# Patient Record
Sex: Male | Born: 1970 | Race: Black or African American | Hispanic: No | Marital: Married | Smoking: Current every day smoker
Health system: Southern US, Community
[De-identification: ages and names within clinical notes are randomized; demographics above are authoritative.]

## PROBLEM LIST (undated history)

## (undated) DIAGNOSIS — T7840XA Allergy, unspecified, initial encounter: Secondary | ICD-10-CM

## (undated) HISTORY — PX: HERNIA REPAIR: SHX51

## (undated) HISTORY — PX: APPENDECTOMY: SHX54

---

## 2018-03-08 ENCOUNTER — Other Ambulatory Visit: Payer: Self-pay

## 2018-03-08 ENCOUNTER — Encounter (HOSPITAL_BASED_OUTPATIENT_CLINIC_OR_DEPARTMENT_OTHER): Payer: Self-pay | Admitting: Emergency Medicine

## 2018-03-08 ENCOUNTER — Emergency Department (HOSPITAL_BASED_OUTPATIENT_CLINIC_OR_DEPARTMENT_OTHER)
Admission: EM | Admit: 2018-03-08 | Discharge: 2018-03-08 | Disposition: A | Discharge status: Home / Self Care | Payer: Medicaid Other | Attending: Emergency Medicine | Admitting: Emergency Medicine

## 2018-03-08 ENCOUNTER — Emergency Department (HOSPITAL_BASED_OUTPATIENT_CLINIC_OR_DEPARTMENT_OTHER): Payer: Medicaid Other

## 2018-03-08 DIAGNOSIS — R079 Chest pain, unspecified: Secondary | ICD-10-CM | POA: Diagnosis present

## 2018-03-08 DIAGNOSIS — R0789 Other chest pain: Secondary | ICD-10-CM | POA: Insufficient documentation

## 2018-03-08 DIAGNOSIS — F17210 Nicotine dependence, cigarettes, uncomplicated: Secondary | ICD-10-CM | POA: Insufficient documentation

## 2018-03-08 LAB — CBC
HCT: 44.2 % (ref 39.0–52.0)
Hemoglobin: 13.8 g/dL (ref 13.0–17.0)
MCH: 28.8 pg (ref 26.0–34.0)
MCHC: 31.2 g/dL (ref 30.0–36.0)
MCV: 92.3 fL (ref 80.0–100.0)
Platelets: 237 10*3/uL (ref 150–400)
RBC: 4.79 MIL/uL (ref 4.22–5.81)
RDW: 14.1 % (ref 11.5–15.5)
WBC: 7.8 10*3/uL (ref 4.0–10.5)
nRBC: 0 % (ref 0.0–0.2)

## 2018-03-08 LAB — COMPREHENSIVE METABOLIC PANEL
ALT: 19 U/L (ref 0–44)
ANION GAP: 7 (ref 5–15)
AST: 21 U/L (ref 15–41)
Albumin: 4.1 g/dL (ref 3.5–5.0)
Alkaline Phosphatase: 55 U/L (ref 38–126)
BUN: 10 mg/dL (ref 6–20)
CO2: 24 mmol/L (ref 22–32)
Calcium: 8.8 mg/dL — ABNORMAL LOW (ref 8.9–10.3)
Chloride: 104 mmol/L (ref 98–111)
Creatinine, Ser: 0.75 mg/dL (ref 0.61–1.24)
GFR calc Af Amer: >60 mL/min (ref >60)
GFR calc non Af Amer: >60 mL/min (ref >60)
Glucose, Bld: 105 mg/dL — ABNORMAL HIGH (ref 70–99)
Potassium: 3.2 mmol/L — ABNORMAL LOW (ref 3.5–5.1)
Sodium: 135 mmol/L (ref 135–145)
Total Bilirubin: 0.2 mg/dL — ABNORMAL LOW (ref 0.3–1.2)
Total Protein: 7.1 g/dL (ref 6.5–8.1)

## 2018-03-08 LAB — TROPONIN I: Troponin I: <0.03 ng/mL (ref <0.03)

## 2018-03-08 LAB — LIPASE, BLOOD: Lipase: 19 U/L (ref 11–51)

## 2018-03-08 MED ORDER — KETOROLAC TROMETHAMINE 30 MG/ML IJ SOLN
30.0000 mg | Freq: Once | INTRAMUSCULAR | Status: AC
  Administered 2018-03-08: 30 mg via INTRAVENOUS
  Filled 2018-03-08: qty 1

## 2018-03-08 MED ORDER — IBUPROFEN 600 MG PO TABS: 600.0000 mg | ORAL_TABLET | Freq: Four times a day (QID) | ORAL | 0 refills | Status: AC | PRN

## 2018-03-08 MED ORDER — CYCLOBENZAPRINE HCL 10 MG PO TABS: 10.0000 mg | ORAL_TABLET | Freq: Two times a day (BID) | ORAL | 0 refills | Status: AC | PRN

## 2018-03-08 NOTE — ED Notes (Signed)
ED Provider at bedside. 

## 2018-03-08 NOTE — ED Triage Notes (Addendum)
L side chest pain for a few days. Pain increases with movement and touch.

## 2018-03-08 NOTE — ED Provider Notes (Signed)
[facility] EMERGENCY DEPARTMENT Provider Note   CSN: [id number] Arrival date & time: 03/08/18  1747     History   Chief Complaint Chief Complaint  Patient presents with  . Chest Pain    HPI Patrick Decker is a 48 y.o. male.  The history is provided by the patient. No language interpreter was used.  Chest Pain       48 year old male with history of tobacco abuse who presenting for evaluation of chest pain.  Patient report for the past 3 to 4 days he has been experiencing recurrent pain to his left side of chest.  Pain is a sharp pain sensation, worsening with movement.  Pain is minimal at this time but was more intense earlier part of the day.  He denies any associated fever, chills, lightheadedness, dizziness, shortness of breath, nausea, vomiting, abdominal pain, diaphoresis.  Pain is nonradiating.  He did recall roughhousing with his young children recently and was unsure if that may have caused this pain.  Aside from rest, no other specific treatment tried.  Patient does smoke a pack of cigarettes a day.  He denies any significant family history of cardiac disease.  He does admits to using alcohol on a regular basis but denies any recreational drug use.    History reviewed. No pertinent past medical history.  There are no active problems to display for this patient.   Past Surgical History:  Procedure Laterality Date  . HERNIA REPAIR          Home Medications    Prior to Admission medications   Not on File    Family History No family history on file.  Social History Social History   Tobacco Use  . Smoking status: Current Every Day Smoker  . Smokeless tobacco: Never Used  Substance Use Topics  . Alcohol use: Yes  . Drug use: Never     Allergies   Patient has no known allergies.   Review of Systems Review of Systems  Cardiovascular: Positive for chest pain.  All other systems reviewed and are negative.    Physical  Exam Updated Vital Signs BP 128/81 (BP Location: Left Arm)   Pulse 94   Temp 98.8 F (37.1 C) (Oral)   Resp 16   Ht 5\' 3"  (1.6 m)   Wt 70.3 kg   SpO2 99%   BMI 27.46 kg/m   Physical Exam Vitals signs and nursing note reviewed.  Constitutional:      General: He is not in acute distress.    Appearance: He is well-developed.  HENT:     Head: Atraumatic.  Eyes:     Conjunctiva/sclera: Conjunctivae normal.  Neck:     Musculoskeletal: Neck supple.  Cardiovascular:     Rate and Rhythm: Normal rate and regular rhythm.     Heart sounds: Heart sounds are distant.  Pulmonary:     Effort: Pulmonary effort is normal. Tachypnea present. No accessory muscle usage or respiratory distress.     Breath sounds: No stridor.  Chest:     Chest wall: Tenderness (Tenderness to left anterior inferior chest wall on palpation without any overlying skin changes, crepitus, or emphysema.) present.  Abdominal:     Palpations: Abdomen is soft.     Tenderness: There is no abdominal tenderness.  Musculoskeletal:        General: No swelling.  Skin:    Findings: No rash.  Neurological:     Mental Status: He is alert and oriented to  person, place, and time.  Psychiatric:        Mood and Affect: Mood normal.      ED Treatments / Results  Labs (all labs ordered are listed, but only abnormal results are displayed) Labs Reviewed  COMPREHENSIVE METABOLIC PANEL - Abnormal; Notable for the following components:      Result Value   Potassium 3.2 (*)    Glucose, Bld 105 (*)    Calcium 8.8 (*)    Total Bilirubin 0.2 (*)    All other components within normal limits  CBC  TROPONIN I  LIPASE, BLOOD    EKG EKG Interpretation  Date/Time:  Wednesday March 08 2018 17:55:15 EST Ventricular Rate:  94 PR Interval:  144 QRS Duration: 88 QT Interval:  346 QTC Calculation: 432 R Axis:   86 Text Interpretation:  Normal sinus rhythm Normal ECG No previous tracing Confirmed by Gwyneth Sprout ([id number]) on  03/08/2018 7:06:14 PM    Date: 03/08/2018  Rate: 94  Rhythm: normal sinus rhythm  QRS Axis: normal  Intervals: normal  ST/T Wave abnormalities: normal  Conduction Disutrbances: none  Narrative Interpretation:   Old EKG Reviewed: No significant changes noted     Radiology Dg Chest 2 View  Result Date: 03/08/2018 CLINICAL DATA:  Chest pain for 5 days. EXAM: CHEST - 2 VIEW COMPARISON:  None. FINDINGS: The heart size and mediastinal contours are within normal limits. There is no focal infiltrate, pulmonary edema, or pleural effusion. There is scoliosis of spine. IMPRESSION: No active cardiopulmonary disease. Electronically Signed   By: Sherian Rein M.D.   On: 03/08/2018 18:25    Procedures Procedures (including critical care time)  Medications Ordered in ED Medications  ketorolac (TORADOL) 30 MG/ML injection 30 mg (30 mg Intravenous Given 03/08/18 1854)     Initial Impression / Assessment and Plan / ED Course  I have reviewed the triage vital signs and the nursing notes.  Pertinent labs & imaging results that were available during my care of the patient were reviewed by me and considered in my medical decision making (see chart for details).     BP (!) 139/94   Pulse 95   Temp 98.8 F (37.1 C) (Oral)   Resp 12   Ht 5\' 3"  (1.6 m)   Wt 70.3 kg   SpO2 96%   BMI 27.46 kg/m    Final Clinical Impressions(s) / ED Diagnoses   Final diagnoses:  Left-sided chest wall pain    ED Discharge Orders         Ordered    ibuprofen (ADVIL,MOTRIN) 600 MG tablet  Every 6 hours PRN     03/08/18 1958    cyclobenzaprine (FLEXERIL) 10 MG tablet  2 times daily PRN     03/08/18 1958         6:27 PM Patient here with reproducible left-sided chest wall pain.  Pain is atypical for ACS.  Does not have any significant abdominal pain on exam however due to location and history of alcohol use, will check lipase and belly labs along with chest x-ray and troponin.  Toradol given.  Suspect  musculoskeletal pain at this time. HEART score of 2, low risk of MACE.   7:57 PM Work up unremarkable.  Will provide sxs treatment.  Pt agrees.  Smoking cessation discussed.  Return precaution given.     Fayrene Helper, PA-C 03/08/18 Babette Relic    Gwyneth Sprout, MD 03/09/18 2152

## 2019-01-03 ENCOUNTER — Encounter (HOSPITAL_BASED_OUTPATIENT_CLINIC_OR_DEPARTMENT_OTHER): Payer: Self-pay | Admitting: Emergency Medicine

## 2019-01-03 ENCOUNTER — Emergency Department (HOSPITAL_BASED_OUTPATIENT_CLINIC_OR_DEPARTMENT_OTHER): Payer: Medicaid Other

## 2019-01-03 ENCOUNTER — Emergency Department (HOSPITAL_BASED_OUTPATIENT_CLINIC_OR_DEPARTMENT_OTHER)
Admission: EM | Admit: 2019-01-03 | Discharge: 2019-01-03 | Disposition: A | Discharge status: Home / Self Care | Payer: Medicaid Other | Attending: Emergency Medicine | Admitting: Emergency Medicine

## 2019-01-03 DIAGNOSIS — Y9389 Activity, other specified: Secondary | ICD-10-CM | POA: Insufficient documentation

## 2019-01-03 DIAGNOSIS — S99922A Unspecified injury of left foot, initial encounter: Secondary | ICD-10-CM | POA: Diagnosis present

## 2019-01-03 DIAGNOSIS — W2203XA Walked into furniture, initial encounter: Secondary | ICD-10-CM | POA: Diagnosis not present

## 2019-01-03 DIAGNOSIS — S92535A Nondisplaced fracture of distal phalanx of left lesser toe(s), initial encounter for closed fracture: Secondary | ICD-10-CM | POA: Insufficient documentation

## 2019-01-03 DIAGNOSIS — Y9289 Other specified places as the place of occurrence of the external cause: Secondary | ICD-10-CM | POA: Diagnosis not present

## 2019-01-03 DIAGNOSIS — F17210 Nicotine dependence, cigarettes, uncomplicated: Secondary | ICD-10-CM | POA: Insufficient documentation

## 2019-01-03 DIAGNOSIS — Y999 Unspecified external cause status: Secondary | ICD-10-CM | POA: Insufficient documentation

## 2019-01-03 HISTORY — DX: Allergy, unspecified, initial encounter: T78.40XA

## 2019-01-03 MED ORDER — OXYCODONE-ACETAMINOPHEN 5-325 MG PO TABS
2.0000 | ORAL_TABLET | Freq: Once | ORAL | Status: AC
  Administered 2019-01-03: 06:00:00 2 via ORAL
  Filled 2019-01-03: qty 2

## 2019-01-03 MED ORDER — OXYCODONE-ACETAMINOPHEN 5-325 MG PO TABS: 2.0000 | ORAL_TABLET | ORAL | 0 refills | Status: AC | PRN

## 2019-01-03 NOTE — ED Triage Notes (Signed)
Pt states he was moving furniture last night and his his toe  Pt is c/o pain to the 4t ote on the left foot  Pt has swelling noted
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
## 2019-01-03 NOTE — ED Notes (Signed)
ED Provider at bedside. 

## 2020-08-09 ENCOUNTER — Encounter (HOSPITAL_BASED_OUTPATIENT_CLINIC_OR_DEPARTMENT_OTHER): Payer: Self-pay

## 2020-08-09 ENCOUNTER — Other Ambulatory Visit: Payer: Self-pay

## 2020-08-09 ENCOUNTER — Emergency Department (HOSPITAL_BASED_OUTPATIENT_CLINIC_OR_DEPARTMENT_OTHER)
Admission: EM | Admit: 2020-08-09 | Discharge: 2020-08-09 | Disposition: A | Discharge status: Home / Self Care | Payer: Medicaid Other | Attending: Emergency Medicine | Admitting: Emergency Medicine

## 2020-08-09 ENCOUNTER — Emergency Department (HOSPITAL_BASED_OUTPATIENT_CLINIC_OR_DEPARTMENT_OTHER): Payer: Medicaid Other

## 2020-08-09 DIAGNOSIS — R519 Headache, unspecified: Secondary | ICD-10-CM | POA: Insufficient documentation

## 2020-08-09 DIAGNOSIS — Y92410 Unspecified street and highway as the place of occurrence of the external cause: Secondary | ICD-10-CM | POA: Insufficient documentation

## 2020-08-09 DIAGNOSIS — M542 Cervicalgia: Secondary | ICD-10-CM | POA: Diagnosis not present

## 2020-08-09 DIAGNOSIS — F17210 Nicotine dependence, cigarettes, uncomplicated: Secondary | ICD-10-CM | POA: Insufficient documentation

## 2020-08-09 MED ORDER — OXYCODONE-ACETAMINOPHEN 5-325 MG PO TABS
1.0000 | ORAL_TABLET | Freq: Once | ORAL | Status: AC
  Administered 2020-08-09: 1 via ORAL
  Filled 2020-08-09: qty 1
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
## 2020-08-09 NOTE — Discharge Instructions (Addendum)
Call your primary care doctor or specialist as discussed in the next 2-3 days.   Return immediately back to the ER if:  Your symptoms worsen within the next 12-24 hours. You develop new symptoms such as new fevers, persistent vomiting, new pain, shortness of breath, or new weakness or numbness, or if you have any other concerns.  
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
